# Patient Record
Sex: Female | Born: 1985 | Race: Black or African American | Hispanic: No | Smoking: Former smoker
Health system: Southern US, Community
[De-identification: ages and names within clinical notes are randomized; demographics above are authoritative.]

## PROBLEM LIST (undated history)

## (undated) ENCOUNTER — Ambulatory Visit (HOSPITAL_COMMUNITY): Admission: EM | Payer: Medicaid Other

## (undated) DIAGNOSIS — E119 Type 2 diabetes mellitus without complications: Secondary | ICD-10-CM

## (undated) DIAGNOSIS — F32A Depression, unspecified: Secondary | ICD-10-CM

## (undated) DIAGNOSIS — F329 Major depressive disorder, single episode, unspecified: Secondary | ICD-10-CM

## (undated) DIAGNOSIS — E079 Disorder of thyroid, unspecified: Secondary | ICD-10-CM

## (undated) HISTORY — PX: OTHER SURGICAL HISTORY: SHX169

## (undated) HISTORY — PX: WISDOM TOOTH EXTRACTION: SHX21

---
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
## 2020-02-21 ENCOUNTER — Encounter: Payer: Self-pay | Admitting: Emergency Medicine

## 2020-02-21 ENCOUNTER — Ambulatory Visit
Admission: EM | Admit: 2020-02-21 | Discharge: 2020-02-21 | Disposition: A | Discharge status: Home / Self Care | Payer: Medicaid Other | Attending: Physician Assistant | Admitting: Physician Assistant

## 2020-02-21 ENCOUNTER — Other Ambulatory Visit: Payer: Self-pay

## 2020-02-21 DIAGNOSIS — R109 Unspecified abdominal pain: Secondary | ICD-10-CM | POA: Diagnosis present

## 2020-02-21 DIAGNOSIS — N898 Other specified noninflammatory disorders of vagina: Secondary | ICD-10-CM | POA: Diagnosis not present

## 2020-02-21 HISTORY — DX: Type 2 diabetes mellitus without complications: E11.9

## 2020-02-21 HISTORY — DX: Depression, unspecified: F32.A

## 2020-02-21 HISTORY — DX: Disorder of thyroid, unspecified: E07.9

## 2020-02-21 LAB — POCT URINALYSIS DIP (MANUAL ENTRY)
Bilirubin, UA: NEGATIVE
Glucose, UA: NEGATIVE mg/dL
Ketones, POC UA: NEGATIVE mg/dL
Leukocytes, UA: NEGATIVE
Nitrite, UA: NEGATIVE
Protein Ur, POC: NEGATIVE mg/dL
Spec Grav, UA: >=1.03 — AB (ref 1.010–1.025)
Urobilinogen, UA: 0.2 E.U./dL
pH, UA: 6 (ref 5.0–8.0)

## 2020-02-21 LAB — POCT URINE PREGNANCY: Preg Test, Ur: NEGATIVE

## 2020-02-21 MED ORDER — METHOCARBAMOL 500 MG PO TABS: 500.0000 mg | ORAL_TABLET | Freq: Two times a day (BID) | ORAL | 0 refills | Status: DC

## 2020-02-21 MED ORDER — MELOXICAM 7.5 MG PO TABS: 7.5000 mg | ORAL_TABLET | Freq: Every day | ORAL | 0 refills | Status: DC

## 2020-02-21 MED ORDER — TAMSULOSIN HCL 0.4 MG PO CAPS: 0.4000 mg | ORAL_CAPSULE | Freq: Every day | ORAL | 0 refills | Status: DC

## 2020-02-21 NOTE — Discharge Instructions (Addendum)
Urine negative for infection. Does show some blood, which could be from a kidney stone. Start  flomax as directed. We will also cover for muscle pain. Start Mobic. Do not take ibuprofen (motrin/advil)/ naproxen (aleve) while on mobic. Robaxin as needed, this can make you drowsy, so do not take if you are going to drive, operate heavy machinery, or make important decisions. Cytology sent, you will be contacted with any positive results. If significant worsening of symptoms, go to the emergency department for further evaluation.

## 2020-02-21 NOTE — ED Provider Notes (Signed)
[facility]    CSN: [id number] Arrival date & time: 02/21/20  1037      History   Chief Complaint Chief Complaint  Patient presents with  . Flank Pain    HPI Michaela Diaz is a 34 y.o. female.   34 year old female comes in for 3 day history of flank pain. Feels bladder pressure when urination. Denies frequency, dysuria, hematuria. Nausea with vomiting that resolved after omeprazole. White vaginal discharge, no itching, odor. Right flank pain, present when sitting. Denies injury/trauma. LMP 01/28/2020. Sexually active 1 female partner. Changes in body wash. On OCP.      Past Medical History:  Diagnosis Date  . Depression   . Diabetes mellitus without complication ([place])   . Thyroid disease     There are no problems to display for this patient.   Past Surgical History:  Procedure Laterality Date  . removal of extra digits    . WISDOM TOOTH EXTRACTION      OB History   No obstetric history on file.      Home Medications    Prior to Admission medications   Medication Sig Start Date End Date Taking? Authorizing Provider  glycopyrrolate (ROBINUL) 2 MG tablet TAKE 1 TABLET BY MOUTH DAILY. 01/26/19  Yes [provider]  metFORMIN (GLUCOPHAGE-XR) 500 MG 24 hr tablet Take by mouth. 03/31/19  Yes [provider]  nystatin (MYCOSTATIN/NYSTOP) powder Apply to affected area daily 11/18/19  Yes [provider]  doxepin (SINEQUAN) 10 MG capsule Take 10 mg by mouth at bedtime. 09/30/19   [provider]  FLUoxetine (PROZAC) 20 MG capsule Take 60 mg by mouth every morning. 12/28/19   [provider]  meloxicam (MOBIC) 7.5 MG tablet Take 1 tablet (7.5 mg total) by mouth daily. 02/21/20   Ok Edwards, PA-C  methimazole (TAPAZOLE) 5 MG tablet  12/07/19   [provider]  methocarbamol (ROBAXIN) 500 MG tablet Take 1 tablet (500 mg total) by mouth 2 (two) times daily. 02/21/20   Damyiah Moxley V, PA-C  MICROGESTIN FE 1.5/30  1.5-30 MG-MCG tablet Take 1 tablet by mouth daily. 02/01/20   [provider]  ondansetron (ZOFRAN) 4 MG tablet Take 4 mg by mouth every 8 (eight) hours as needed. 11/18/19   [provider]  tamsulosin (FLOMAX) 0.4 MG CAPS capsule Take 1 capsule (0.4 mg total) by mouth daily. 02/21/20   Tasia Catchings, Lary Eckardt V, PA-C  Vitamin D, Ergocalciferol, (DRISDOL) 1.25 MG (50000 UNIT) CAPS capsule Take 50,000 Units by mouth once a week. 11/19/19   [provider]    Family History Family History  Problem Relation Age of Onset  . Diabetes Mother   . Depression Mother     Social History Social History   Tobacco Use  . Smoking status: Current Some Day Smoker  Substance Use Topics  . Alcohol use: Yes  . Drug use: Never     Allergies   Metronidazole and Latex   Review of Systems Review of Systems  Reason unable to perform ROS: See HPI as above.     Physical Exam Triage Vital Signs ED Triage Vitals [02/21/20 1057]  Enc Vitals Group     BP 118/81     Pulse Rate 95     Resp 16     Temp 97.9 F (36.6 C)     Temp Source Oral     SpO2 98 %     Weight      Height  Head Circumference      Peak Flow      Pain Score 5     Pain Loc      Pain Edu?      Excl. in GC?    No data found.  Updated Vital Signs BP 118/81 (BP Location: Right Arm)   Pulse 95   Temp 97.9 F (36.6 C) (Oral)   Resp 16   LMP 01/28/2020   SpO2 98%   Physical Exam Constitutional:      General: She is not in acute distress.    Appearance: She is well-developed. She is not ill-appearing, toxic-appearing or diaphoretic.  HENT:     Head: Normocephalic and atraumatic.  Eyes:     Conjunctiva/sclera: Conjunctivae normal.     Pupils: Pupils are equal, round, and reactive to light.  Cardiovascular:     Rate and Rhythm: Normal rate and regular rhythm.  Pulmonary:     Effort: Pulmonary effort is normal. No respiratory distress.     Comments: LCTAB Abdominal:     General: Bowel sounds are  normal.     Palpations: Abdomen is soft.     Tenderness: There is no abdominal tenderness. There is no right CVA tenderness, left CVA tenderness, guarding or rebound.  Musculoskeletal:     Cervical back: Normal range of motion and neck supple.     Comments: No tenderness to palpation of spinous processes. Tenderness to palpation of right lateral lumbar region. Full ROM of back. Negative straight leg raise  Skin:    General: Skin is warm and dry.  Neurological:     Mental Status: She is alert and oriented to person, place, and time.  Psychiatric:        Behavior: Behavior normal.        Judgment: Judgment normal.      UC Treatments / Results  Labs (all labs ordered are listed, but only abnormal results are displayed) Labs Reviewed  POCT URINALYSIS DIP (MANUAL ENTRY) - Abnormal; Notable for the following components:      Result Value   Spec Grav, UA >=1.030 (*)    Blood, UA trace-intact (*)    All other components within normal limits  POCT URINE PREGNANCY  CERVICOVAGINAL ANCILLARY ONLY    EKG   Radiology No results found.  Procedures Procedures (including critical care time)  Medications Ordered in UC Medications - No data to display  Initial Impression / Assessment and Plan / UC Course  I have reviewed the triage vital signs and the nursing notes.  Pertinent labs & imaging results that were available during my care of the patient were reviewed by me and considered in my medical decision making (see chart for details).    Urine dipstick with blood, discussed possibility of kidney stone causing symptoms, though lower suspicion given current exam. However, patient states had been told possible kidney stones in the past with similar symptoms. Start flomax as directed. Will also treat for MSK pain with NSAIDs and muscle relaxant. Will send for cytology. Return precautions given. Patient expresses understanding and agrees to plan.   Final Clinical Impressions(s) / UC  Diagnoses   Final diagnoses:  Right flank pain  Vaginal discharge    ED Prescriptions    Medication Sig Dispense Auth. Provider   meloxicam (MOBIC) 7.5 MG tablet Take 1 tablet (7.5 mg total) by mouth daily. 10 tablet Vidal Lampkins V, PA-C   methocarbamol (ROBAXIN) 500 MG tablet Take 1 tablet (500 mg total) by mouth 2 (  two) times daily. 20 tablet Mical Brun V, PA-C   tamsulosin (FLOMAX) 0.4 MG CAPS capsule Take 1 capsule (0.4 mg total) by mouth daily. 10 capsule Belinda Fisher, PA-C     PDMP not reviewed this encounter.   Belinda Fisher, PA-C 02/21/20 1359

## 2020-02-21 NOTE — ED Triage Notes (Signed)
3 days ago started having flank pain and bladder pressure. Pain upon urination.

## 2020-02-23 LAB — CERVICOVAGINAL ANCILLARY ONLY
Bacterial Vaginitis (gardnerella): POSITIVE — AB
Candida Glabrata: NEGATIVE
Candida Vaginitis: NEGATIVE
Chlamydia: NEGATIVE
Comment: NEGATIVE
Comment: NEGATIVE
Comment: NEGATIVE
Comment: NEGATIVE
Comment: NEGATIVE
Comment: NORMAL
Neisseria Gonorrhea: NEGATIVE
Trichomonas: NEGATIVE

## 2020-02-24 ENCOUNTER — Telehealth (HOSPITAL_COMMUNITY): Payer: Self-pay

## 2020-02-24 MED ORDER — METRONIDAZOLE 500 MG PO TABS: 500.0000 mg | ORAL_TABLET | Freq: Two times a day (BID) | ORAL | 0 refills | Status: DC

## 2020-06-16 ENCOUNTER — Emergency Department (HOSPITAL_COMMUNITY)
Admission: EM | Admit: 2020-06-16 | Discharge: 2020-06-17 | Disposition: A | Discharge status: Home / Self Care | Payer: Managed Care, Other (non HMO) | Attending: Emergency Medicine | Admitting: Emergency Medicine

## 2020-06-16 ENCOUNTER — Encounter (HOSPITAL_COMMUNITY): Payer: Self-pay | Admitting: Obstetrics and Gynecology

## 2020-06-16 ENCOUNTER — Other Ambulatory Visit: Payer: Self-pay

## 2020-06-16 DIAGNOSIS — G43909 Migraine, unspecified, not intractable, without status migrainosus: Secondary | ICD-10-CM | POA: Diagnosis not present

## 2020-06-16 DIAGNOSIS — R110 Nausea: Secondary | ICD-10-CM | POA: Diagnosis present

## 2020-06-16 DIAGNOSIS — Z5321 Procedure and treatment not carried out due to patient leaving prior to being seen by health care provider: Secondary | ICD-10-CM | POA: Diagnosis not present

## 2020-06-16 LAB — CBG MONITORING, ED: Glucose-Capillary: 85 mg/dL (ref 70–99)

## 2020-06-16 NOTE — ED Triage Notes (Signed)
Patient reports she wants to eat but feels nauseated and has a migraine. Patient reports this happened before and she needed fluids and migraine cocktail and felt better

## 2021-07-30 ENCOUNTER — Other Ambulatory Visit: Payer: Self-pay

## 2022-03-18 ENCOUNTER — Emergency Department (HOSPITAL_COMMUNITY): Payer: Managed Care, Other (non HMO)

## 2022-03-18 ENCOUNTER — Other Ambulatory Visit: Payer: Self-pay

## 2022-03-18 ENCOUNTER — Encounter (HOSPITAL_COMMUNITY): Payer: Self-pay

## 2022-03-18 ENCOUNTER — Emergency Department (HOSPITAL_COMMUNITY)
Admission: EM | Admit: 2022-03-18 | Discharge: 2022-03-18 | Disposition: A | Discharge status: Home / Self Care | Payer: Managed Care, Other (non HMO) | Attending: Emergency Medicine | Admitting: Emergency Medicine

## 2022-03-18 DIAGNOSIS — R197 Diarrhea, unspecified: Secondary | ICD-10-CM | POA: Insufficient documentation

## 2022-03-18 DIAGNOSIS — S6992XA Unspecified injury of left wrist, hand and finger(s), initial encounter: Secondary | ICD-10-CM | POA: Insufficient documentation

## 2022-03-18 DIAGNOSIS — R1084 Generalized abdominal pain: Secondary | ICD-10-CM | POA: Insufficient documentation

## 2022-03-18 DIAGNOSIS — R002 Palpitations: Secondary | ICD-10-CM | POA: Insufficient documentation

## 2022-03-18 DIAGNOSIS — F419 Anxiety disorder, unspecified: Secondary | ICD-10-CM | POA: Diagnosis not present

## 2022-03-18 DIAGNOSIS — Z91040 Latex allergy status: Secondary | ICD-10-CM | POA: Insufficient documentation

## 2022-03-18 DIAGNOSIS — Z79899 Other long term (current) drug therapy: Secondary | ICD-10-CM | POA: Diagnosis not present

## 2022-03-18 DIAGNOSIS — G43809 Other migraine, not intractable, without status migrainosus: Secondary | ICD-10-CM | POA: Diagnosis not present

## 2022-03-18 DIAGNOSIS — R630 Anorexia: Secondary | ICD-10-CM | POA: Diagnosis not present

## 2022-03-18 DIAGNOSIS — R0789 Other chest pain: Secondary | ICD-10-CM | POA: Diagnosis not present

## 2022-03-18 LAB — CBC WITH DIFFERENTIAL/PLATELET
Abs Immature Granulocytes: 0.02 10*3/uL (ref 0.00–0.07)
Basophils Absolute: 0 10*3/uL (ref 0.0–0.1)
Basophils Relative: 0 %
Eosinophils Absolute: 0 10*3/uL (ref 0.0–0.5)
Eosinophils Relative: 1 %
HCT: 43.7 % (ref 36.0–46.0)
Hemoglobin: 14.7 g/dL (ref 12.0–15.0)
Immature Granulocytes: 0 %
Lymphocytes Relative: 19 %
Lymphs Abs: 1.3 10*3/uL (ref 0.7–4.0)
MCH: 29.9 pg (ref 26.0–34.0)
MCHC: 33.6 g/dL (ref 30.0–36.0)
MCV: 88.8 fL (ref 80.0–100.0)
Monocytes Absolute: 0.4 10*3/uL (ref 0.1–1.0)
Monocytes Relative: 5 %
Neutro Abs: 5.4 10*3/uL (ref 1.7–7.7)
Neutrophils Relative %: 75 %
Platelets: 422 10*3/uL — ABNORMAL HIGH (ref 150–400)
RBC: 4.92 MIL/uL (ref 3.87–5.11)
RDW: 14.1 % (ref 11.5–15.5)
WBC: 7.1 10*3/uL (ref 4.0–10.5)
nRBC: 0 % (ref 0.0–0.2)

## 2022-03-18 LAB — COMPREHENSIVE METABOLIC PANEL
ALT: 35 U/L (ref 0–44)
AST: 34 U/L (ref 15–41)
Albumin: 4.2 g/dL (ref 3.5–5.0)
Alkaline Phosphatase: 87 U/L (ref 38–126)
Anion gap: 8 (ref 5–15)
BUN: 19 mg/dL (ref 6–20)
CO2: 24 mmol/L (ref 22–32)
Calcium: 9.3 mg/dL (ref 8.9–10.3)
Chloride: 108 mmol/L (ref 98–111)
Creatinine, Ser: 0.94 mg/dL (ref 0.44–1.00)
GFR, Estimated: >60 mL/min (ref >60)
Glucose, Bld: 102 mg/dL — ABNORMAL HIGH (ref 70–99)
Potassium: 4.1 mmol/L (ref 3.5–5.1)
Sodium: 140 mmol/L (ref 135–145)
Total Bilirubin: 1.1 mg/dL (ref 0.3–1.2)
Total Protein: 8.2 g/dL — ABNORMAL HIGH (ref 6.5–8.1)

## 2022-03-18 LAB — LIPASE, BLOOD: Lipase: 28 U/L (ref 11–51)

## 2022-03-18 LAB — URINALYSIS, ROUTINE W REFLEX MICROSCOPIC
Bilirubin Urine: NEGATIVE
Glucose, UA: NEGATIVE mg/dL
Hgb urine dipstick: NEGATIVE
Ketones, ur: NEGATIVE mg/dL
Leukocytes,Ua: NEGATIVE
Nitrite: NEGATIVE
Protein, ur: 30 mg/dL — AB
Specific Gravity, Urine: 1.028 (ref 1.005–1.030)
pH: 5 (ref 5.0–8.0)

## 2022-03-18 LAB — I-STAT BETA HCG BLOOD, ED (MC, WL, AP ONLY): I-stat hCG, quantitative: <5 m[IU]/mL (ref <5)

## 2022-03-18 LAB — TROPONIN I (HIGH SENSITIVITY): Troponin I (High Sensitivity): <2 ng/L (ref <18)

## 2022-03-18 LAB — CBG MONITORING, ED: Glucose-Capillary: 115 mg/dL — ABNORMAL HIGH (ref 70–99)

## 2022-03-18 MED ORDER — METOCLOPRAMIDE HCL 5 MG/ML IJ SOLN
10.0000 mg | Freq: Once | INTRAMUSCULAR | Status: AC
Start: 2022-03-18 — End: 2022-03-18
  Administered 2022-03-18: 10 mg via INTRAVENOUS
  Filled 2022-03-18: qty 2

## 2022-03-18 MED ORDER — LIDOCAINE HCL (PF) 1 % IJ SOLN
30.0000 mL | Freq: Once | INTRAMUSCULAR | Status: DC
  Filled 2022-03-18: qty 30

## 2022-03-18 MED ORDER — CEPHALEXIN 500 MG PO CAPS: 500.0000 mg | ORAL_CAPSULE | Freq: Two times a day (BID) | ORAL | 0 refills | Status: DC

## 2022-03-18 MED ORDER — DIPHENHYDRAMINE HCL 50 MG/ML IJ SOLN
12.5000 mg | Freq: Once | INTRAMUSCULAR | Status: AC
  Administered 2022-03-18: 12.5 mg via INTRAVENOUS
  Filled 2022-03-18: qty 1

## 2022-03-18 MED ORDER — CEPHALEXIN 500 MG PO CAPS: 500.0000 mg | ORAL_CAPSULE | Freq: Two times a day (BID) | ORAL | 0 refills | Status: AC

## 2022-03-18 MED ORDER — SODIUM CHLORIDE 0.9 % IV BOLUS
1000.0000 mL | Freq: Once | INTRAVENOUS | Status: AC
  Administered 2022-03-18: 1000 mL via INTRAVENOUS
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
## 2022-03-18 NOTE — ED Triage Notes (Signed)
Patient presented to the ed with c/o anxiety and she reported she have hx of seizure 10 years ago.  ?

## 2022-03-18 NOTE — Discharge Instructions (Addendum)
Take the antibiotics as directed to prevent infection with your hand injury. ?Make sure you follow-up with your primary care provider and continue your home medications as previously prescribed. ?Return to the ER for signs of infection including redness, swelling, pus draining from the area, fever or other symptoms like chest pain, shortness of breath, severe abdominal pain ?

## 2022-10-28 IMAGING — CT CT HEAD W/O CM
3 series · 16 of 47 positions shown, 19 images · non-contrast
Comparison: MRI dated December 21, 2017

CLINICAL DATA: Syncope/presyncope, cerebrovascular cause suspected



[Series 4: head wo · axial · 0.47mm/px · z∈[+1464,+1604]mm · 10 of 34 slices shown, 13 images]
[im 3/34  brain]
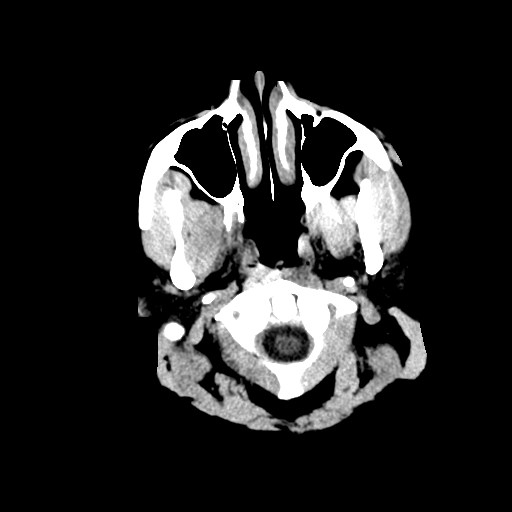
[im 3/34  bone]
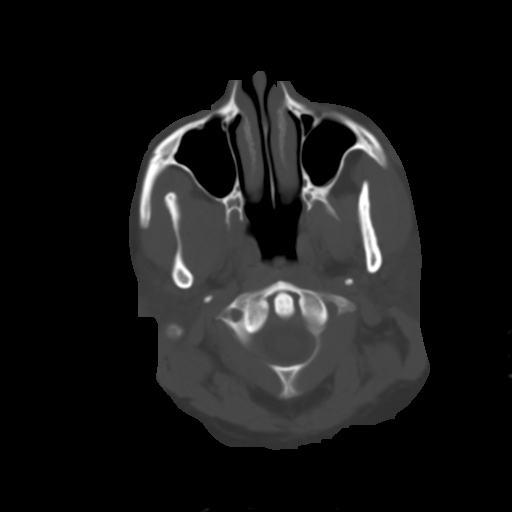
[im 6/34  brain]
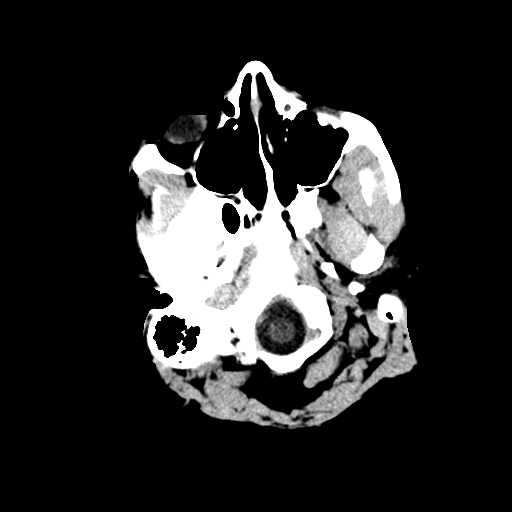
[im 10/34  brain]
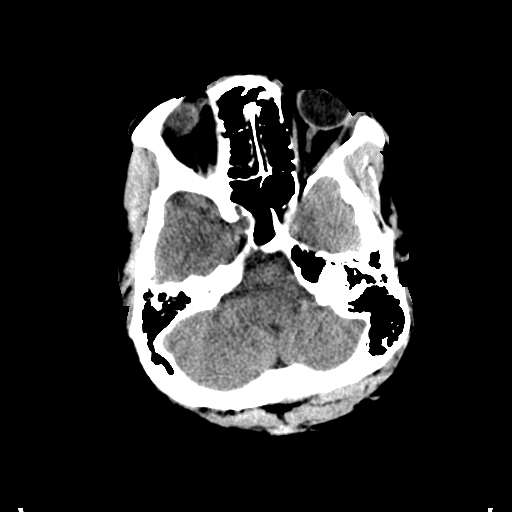
[im 12/34  brain]
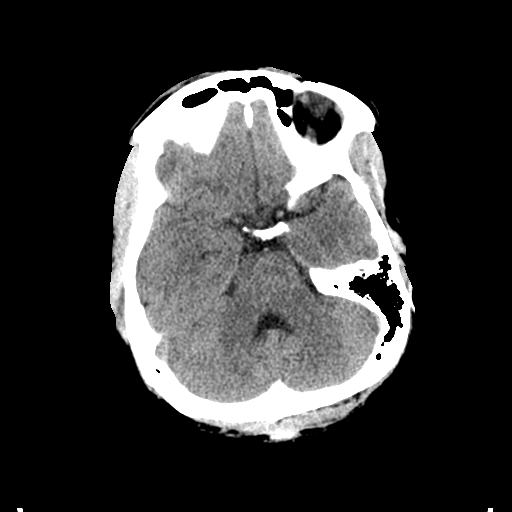
[im 15/34  brain]
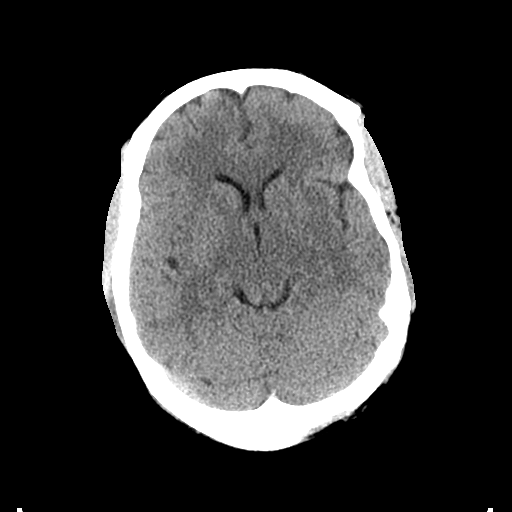
[im 15/34  bone]
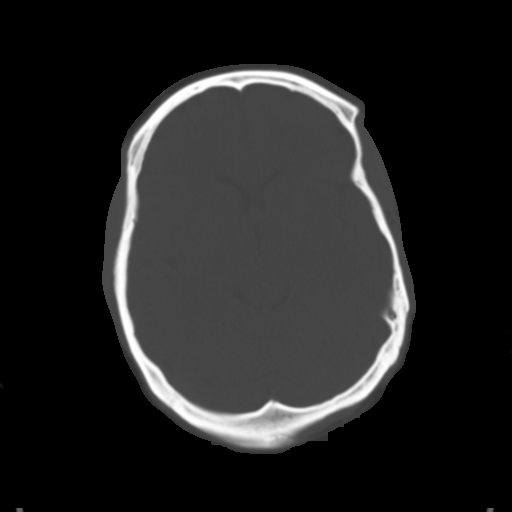
[im 19/34  brain]
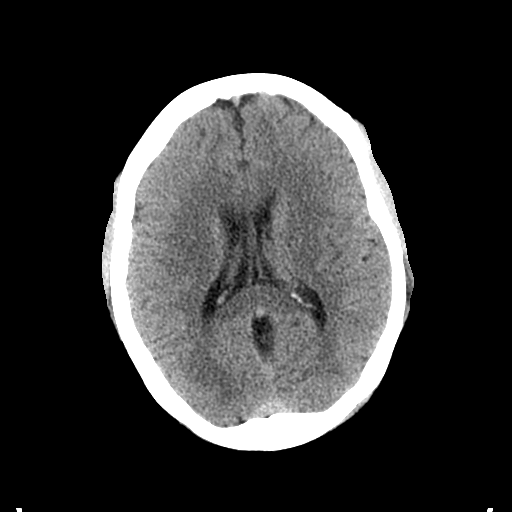
[im 22/34  brain]
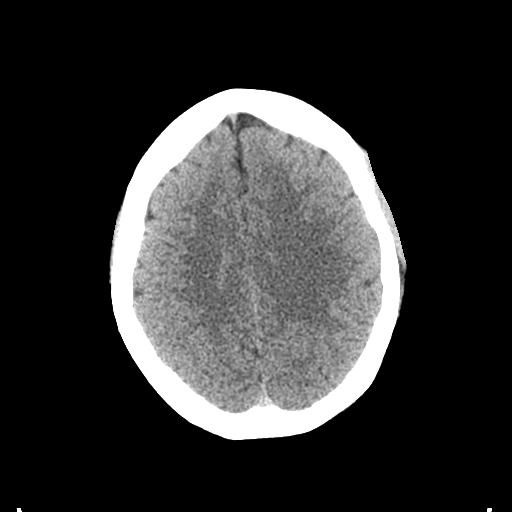
[im 26/34  brain]
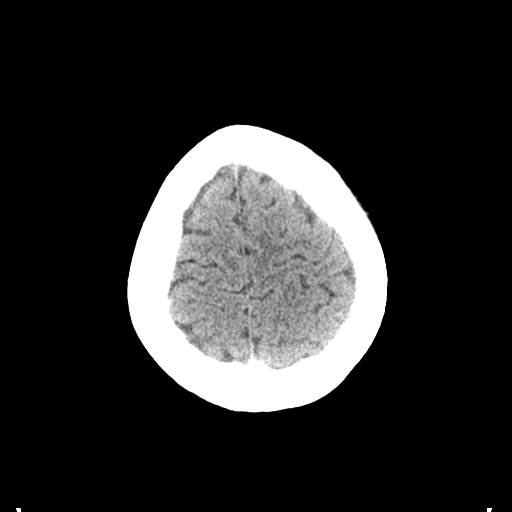
[im 28/34  brain]
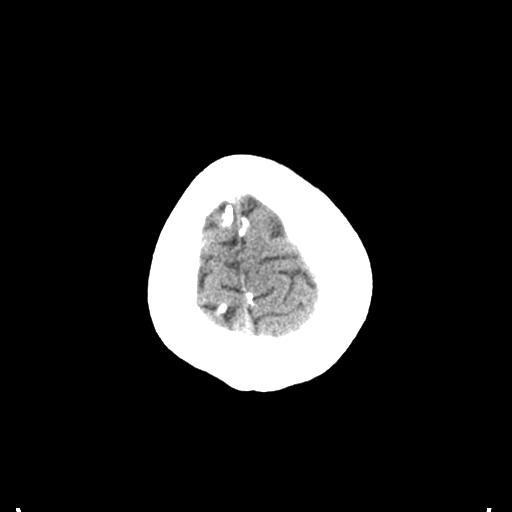
[im 28/34  bone]
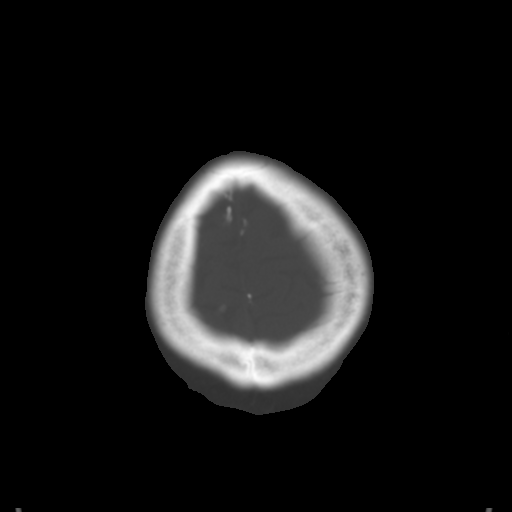
[im 31/34  brain]
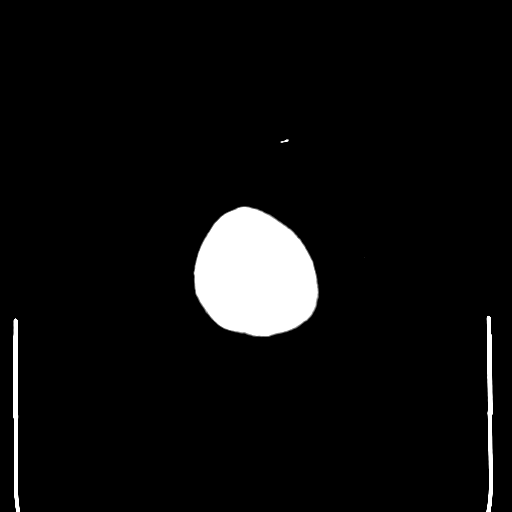

[Series 5: coronal soft tissue · coronal · 0.33mm/px · 3 of 71 slices shown]
[im 24/71  brain]
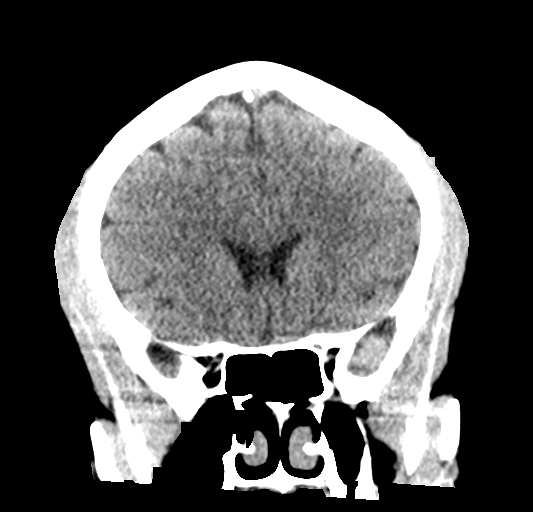
[im 32/71  brain]
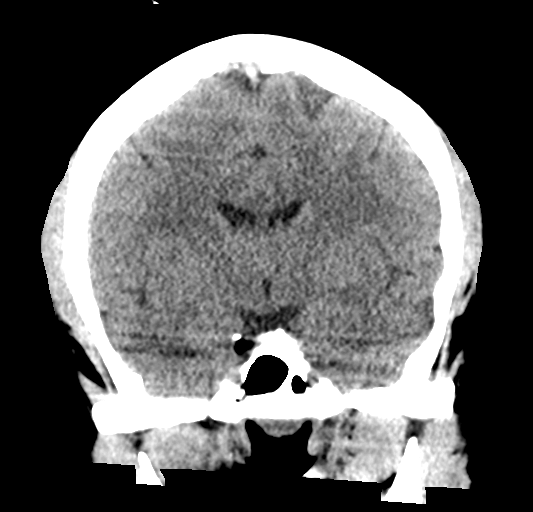
[im 39/71  brain]
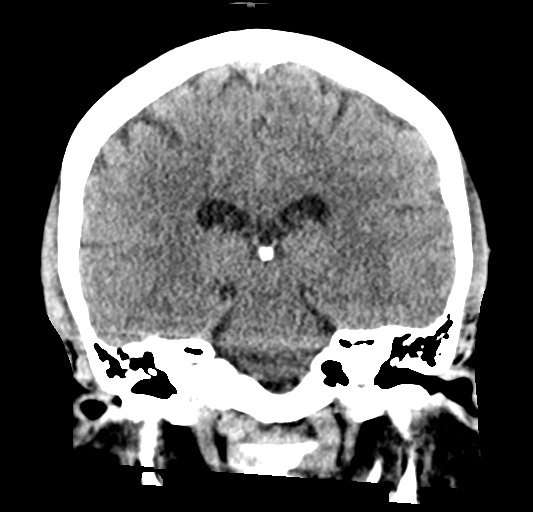

[Series 6: sagittal soft tissue · sagittal · 0.33mm/px · 3 of 60 slices shown]
[im 20/60  brain]
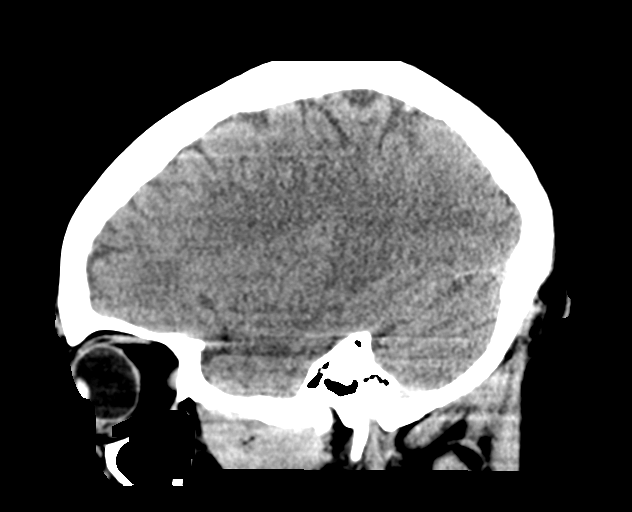
[im 30/60  brain]
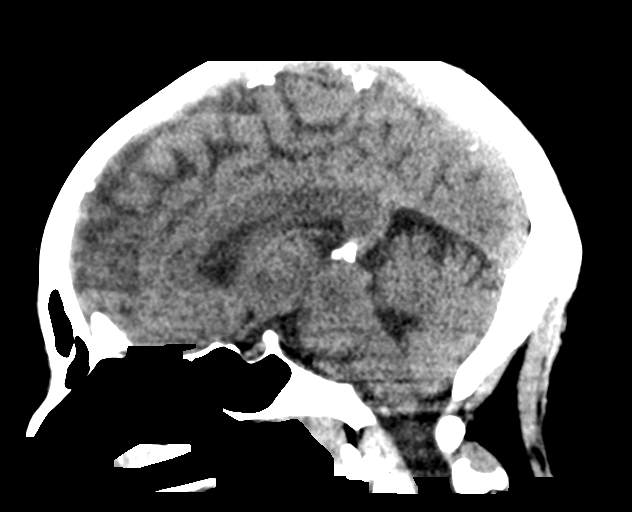
[im 40/60  brain]
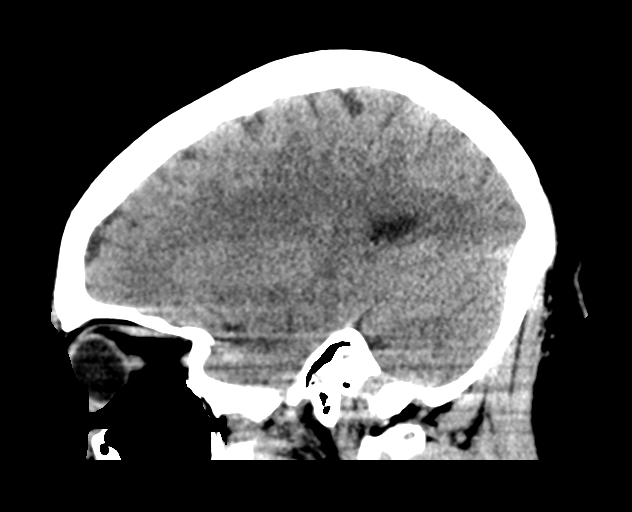

[16 of 47 positions shown; findings below may reference images not displayed]

FINDINGS: Brain: No evidence of acute infarction, hemorrhage, hydrocephalus,
extra-axial collection or mass lesion/mass effect.

Vascular: No hyperdense vessel or unexpected calcification.

Skull: Normal. Negative for fracture or focal lesion.

Sinuses/Orbits: No acute finding.

Other: None.
IMPRESSION: No acute intracranial abnormality.

## 2023-08-01 ENCOUNTER — Encounter: Payer: Self-pay | Admitting: Pulmonary Disease

## 2023-08-01 ENCOUNTER — Institutional Professional Consult (permissible substitution): Payer: Managed Care, Other (non HMO) | Admitting: Pulmonary Disease

## 2023-09-19 ENCOUNTER — Encounter: Payer: Self-pay | Admitting: Nurse Practitioner

## 2023-09-19 ENCOUNTER — Ambulatory Visit (INDEPENDENT_AMBULATORY_CARE_PROVIDER_SITE_OTHER): Payer: Managed Care, Other (non HMO) | Admitting: Nurse Practitioner

## 2023-09-19 VITALS — HR 100 | Temp 98.2°F | Ht 61.5 in | Wt 190.8 lb

## 2023-09-19 DIAGNOSIS — R0683 Snoring: Secondary | ICD-10-CM | POA: Diagnosis not present

## 2023-09-19 DIAGNOSIS — E66812 Obesity, class 2: Secondary | ICD-10-CM | POA: Diagnosis not present

## 2023-09-19 DIAGNOSIS — G4719 Other hypersomnia: Secondary | ICD-10-CM | POA: Diagnosis not present

## 2023-09-19 NOTE — Progress Notes (Signed)
@Patient  ID: Michaela Diaz, female    DOB: [birth year], 37 y.o.   MRN: [id number]  Chief Complaint  Patient presents with   Consult    Fatigue during day.  Not getting restful sleep.  Snoring.  Coughing during sleep and night.    Referring provider: [name], PA  HPI: 37 year old female, former smoker referred for sleep consult. Past medical history significant for GERD, depression, allergic rhinitis, hypothyroid.   TEST/EVENTS:   09/19/2023: Today - sleep consult Patient presents today for sleep consult, referred by [name], PA.  She has had issues with her sleep for the past few years now.  She is also felt very tired during the day.  Symptoms started after she started working third shift.  She just feels like she is never gotten back to feeling rested since then.  She did have a baby 5 months ago but actually feels like her symptoms have slightly improved with the weight loss in the postpartum period.  She has snoring at night.  Does not feel rested when she wakes up in the morning.  Feels sleepy throughout the day.  Her PCP did start her on Adderall which does help.  Not having to take a nap at lunchtime anymore.  Does wake up coughing at night sometimes.  Denies any witnessed apneas, drowsy driving, sleep parasomnia/paralysis.  No history of narcolepsy or symptoms of cataplexy. She goes to bed around 10:30 PM.  Falls asleep within 10 minutes.  Wakes 1-2 times a night.  Usually gets up around 530 to 6 AM.  Does not operate any heavy machinery in her job Animal nutritionist.  No longer working third shift.  Does not seem to have disruptions in her sleep related to this.  She is lost about 35 pounds since she had her baby in May.  He is sleeping through the night.  Never had a sleep study before.  No sleep aids.  Does not use any supplemental oxygen. She had gestational diabetes.  Most recent A1c 5.  No history of cardiac disease or stroke. She is a former smoker and quit October  2023.  She has not drink any alcohol since October 2023.  No excessive caffeine intake.  Lives with herself and 4 kids.  Works as a Clinical biochemist.  She is separated.  No significant family history reported.  Epworth 18   Allergies  Allergen Reactions   Metronidazole Nausea Only   Buspirone Hcl Other (See Comments)    headaches   Latex Rash    Immunization History  Administered Date(s) Administered   PFIZER(Purple Top)SARS-COV-2 Vaccination 06/29/2020, 07/20/2020    Past Medical History:  Diagnosis Date   Depression    Diabetes mellitus without complication (HCC)    Thyroid disease     Tobacco History: Social History   Tobacco Use  Smoking Status Former   Current packs/day: 0.00   Types: Cigarettes   Quit date: 08/2022   Years since quitting: 1.1  Smokeless Tobacco Not on file   Counseling given: Not Answered   Outpatient Medications Prior to Visit  Medication Sig Dispense Refill   amphetamine-dextroamphetamine (ADDERALL XR) 20 MG 24 hr capsule Take 20 mg by mouth daily. 20 mg after lunch     amphetamine-dextroamphetamine (ADDERALL XR) 30 MG 24 hr capsule Take 30 mg by mouth every morning.     esomeprazole (NEXIUM) 40 MG capsule Take 40 mg by mouth daily.     FLUoxetine (PROZAC) 20 MG capsule Take 60 mg  by mouth every morning.     fluticasone (FLONASE) 50 MCG/ACT nasal spray Place 1 spray into both nostrils daily.     ibuprofen (ADVIL) 600 MG tablet Take 600 mg by mouth every 4 (four) hours as needed.     levocetirizine (XYZAL) 5 MG tablet Take 5 mg by mouth every evening.     levothyroxine (SYNTHROID) 137 MCG tablet Take 137 mcg by mouth daily before breakfast.     mometasone (ELOCON) 0.1 % cream Apply 1 Application topically daily.     Prenatal Vit-Fe Fumarate-FA (PRENATAL VITAMIN PLUS LOW IRON) 27-1 MG TABS Take by mouth.     valACYclovir (VALTREX) 500 MG tablet Take by mouth.     doxepin (SINEQUAN) 10 MG capsule Take 10 mg by mouth at bedtime. (Patient not taking:  Reported on 09/19/2023)     glycopyrrolate (ROBINUL) 2 MG tablet TAKE 1 TABLET BY MOUTH DAILY. (Patient not taking: Reported on 09/19/2023)     meloxicam (MOBIC) 7.5 MG tablet Take 1 tablet (7.5 mg total) by mouth daily. (Patient not taking: Reported on 09/19/2023) 10 tablet 0   metFORMIN (GLUCOPHAGE-XR) 500 MG 24 hr tablet Take by mouth. (Patient not taking: Reported on 09/19/2023)     methimazole (TAPAZOLE) 5 MG tablet  (Patient not taking: Reported on 09/19/2023)     methocarbamol (ROBAXIN) 500 MG tablet Take 1 tablet (500 mg total) by mouth 2 (two) times daily. (Patient not taking: Reported on 09/19/2023) 20 tablet 0   metroNIDAZOLE (FLAGYL) 500 MG tablet Take 1 tablet (500 mg total) by mouth 2 (two) times daily. (Patient not taking: Reported on 09/19/2023) 14 tablet 0   MICROGESTIN FE 1.5/30 1.5-30 MG-MCG tablet Take 1 tablet by mouth daily. (Patient not taking: Reported on 09/19/2023)     nystatin (MYCOSTATIN/NYSTOP) powder Apply to affected area daily (Patient not taking: Reported on 09/19/2023)     ondansetron (ZOFRAN) 4 MG tablet Take 4 mg by mouth every 8 (eight) hours as needed. (Patient not taking: Reported on 09/19/2023)     tamsulosin (FLOMAX) 0.4 MG CAPS capsule Take 1 capsule (0.4 mg total) by mouth daily. (Patient not taking: Reported on 09/19/2023) 10 capsule 0   Vitamin D, Ergocalciferol, (DRISDOL) 1.25 MG (50000 UNIT) CAPS capsule Take 50,000 Units by mouth once a week. (Patient not taking: Reported on 09/19/2023)     No facility-administered medications prior to visit.     Review of Systems:   Constitutional: No night sweats, fevers, chills, or lassitude. +fatigue, weight change HEENT: No headaches, difficulty swallowing, tooth/dental problems, or sore throat. No sneezing, itching, ear ache, nasal congestion, or post nasal drip CV:  No chest pain, orthopnea, PND, swelling in lower extremities, anasarca, dizziness, palpitations, syncope Resp: +nocturnal cough, snoring. No  shortness of breath with exertion or at rest. No excess mucus or change in color of mucus. No productive or non-productive. No hemoptysis. No wheezing.  No chest wall deformity GI:  No heartburn, indigestion GU: No nocturia Skin: No rash, lesions, ulcerations MSK:  No joint pain or swelling.   Neuro: No dizziness or lightheadedness.  Psych: No depression or anxiety. Mood stable.     Physical Exam:  Pulse 100   Temp 98.2 F (36.8 C) (Oral)   Ht 5' 1.5" (1.562 m)   Wt 190 lb 12.8 oz (86.5 kg)   SpO2 98%   BMI 35.47 kg/m   GEN: Pleasant, interactive, well-appearing; obese; in no acute distress HEENT:  Normocephalic and atraumatic. PERRLA. Sclera white. Nasal turbinates pink,  moist and patent bilaterally. No rhinorrhea present. Oropharynx pink and moist, without exudate or edema. No lesions, ulcerations, or postnasal drip. Mallampati III NECK:  Supple w/ fair ROM. No JVD present. Normal carotid impulses w/o bruits. Thyroid symmetrical with no goiter or nodules palpated. No lymphadenopathy.   CV: RRR, no m/r/g, no peripheral edema. Pulses intact, +2 bilaterally. No cyanosis, pallor or clubbing. PULMONARY:  Unlabored, regular breathing. Clear bilaterally A&P w/o wheezes/rales/rhonchi. No accessory muscle use.  GI: BS present and normoactive. Soft, non-tender to palpation. No organomegaly or masses detected.  MSK: No erythema, warmth or tenderness. Cap refil <2 sec all extrem. No deformities or joint swelling noted.  Neuro: A/Ox3. No focal deficits noted.   Skin: Warm, no lesions or rashe Psych: Normal affect and behavior. Judgement and thought content appropriate.     Lab Results:  CBC    Component Value Date/Time   WBC 7.1 03/18/2022 1219   RBC 4.92 03/18/2022 1219   HGB 14.7 03/18/2022 1219   HCT 43.7 03/18/2022 1219   PLT 422 (H) 03/18/2022 1219   MCV 88.8 03/18/2022 1219   MCH 29.9 03/18/2022 1219   MCHC 33.6 03/18/2022 1219   RDW 14.1 03/18/2022 1219   LYMPHSABS 1.3  03/18/2022 1219   MONOABS 0.4 03/18/2022 1219   EOSABS 0.0 03/18/2022 1219   BASOSABS 0.0 03/18/2022 1219    BMET    Component Value Date/Time   NA 140 03/18/2022 1311   K 4.1 03/18/2022 1311   CL 108 03/18/2022 1311   CO2 24 03/18/2022 1311   GLUCOSE 102 (H) 03/18/2022 1311   BUN 19 03/18/2022 1311   CREATININE 0.94 03/18/2022 1311   CALCIUM 9.3 03/18/2022 1311   GFRNONAA >60 03/18/2022 1311    BNP No results found for: "BNP"   Imaging:  No results found.  Administration History     None           No data to display          No results found for: "NITRICOXIDE"      Assessment & Plan:   Snoring She has snoring, excessive daytime sleepiness. BMI 35. Epworth 18. Given this,  I am concerned she could have sleep disordered breathing with obstructive sleep apnea. She will need sleep study for further evaluation.    - discussed how weight can impact sleep and risk for sleep disordered breathing - discussed options to assist with weight loss: combination of diet modification, cardiovascular and strength training exercises   - had an extensive discussion regarding the adverse health consequences related to untreated sleep disordered breathing - specifically discussed the risks for hypertension, coronary artery disease, cardiac dysrhythmias, cerebrovascular disease, and diabetes - lifestyle modification discussed   - discussed how sleep disruption can increase risk of accidents, particularly when driving - safe driving practices were discussed  Patient Instructions  Given your symptoms, I am concerned that you may have sleep disordered breathing with sleep apnea. You will need a sleep study for further evaluation. Someone will contact you to schedule this.   We discussed how untreated sleep apnea puts an individual at risk for cardiac arrhthymias, pulm HTN, DM, stroke and increases their risk for daytime accidents. We also briefly reviewed treatment options  including weight loss, side sleeping position, oral appliance, CPAP therapy or referral to ENT for possible surgical options  Use caution when driving and pull over if you become sleepy.  Follow up in 6-8 weeks with Katie Jakell Trusty,NP to go over sleep study  results, or sooner, if needed. Friday PM virtual visit if ok with patient     Excessive daytime sleepiness See above. Possibly due to untreated OSA. Improves with adderrall prescribed by PCP. No aberrant behavior.   Obesity, Class II, BMI 35-39.9 BMI 35. Healthy weight loss encouraged   Advised if symptoms do not improve or worsen, to please contact office for sooner follow up or seek emergency care.   I spent 35 minutes of dedicated to the care of this patient on the date of this encounter to include pre-visit review of records, face-to-face time with the patient discussing conditions above, post visit ordering of testing, clinical documentation with the electronic health record, making appropriate referrals as documented, and communicating necessary findings to members of the patients care team.  Noemi Chapel, NP 09/20/2023  Pt aware and understands NP's role.

## 2023-09-19 NOTE — Patient Instructions (Signed)
Given your symptoms, I am concerned that you may have sleep disordered breathing with sleep apnea. You will need a sleep study for further evaluation. Someone will contact you to schedule this.   We discussed how untreated sleep apnea puts an individual at risk for cardiac arrhthymias, pulm HTN, DM, stroke and increases their risk for daytime accidents. We also briefly reviewed treatment options including weight loss, side sleeping position, oral appliance, CPAP therapy or referral to ENT for possible surgical options  Use caution when driving and pull over if you become sleepy.  Follow up in 6-8 weeks with Michaela Mylynn Dinh,NP to go over sleep study results, or sooner, if needed. Friday PM virtual visit if ok with patient

## 2023-09-20 ENCOUNTER — Encounter: Payer: Self-pay | Admitting: Nurse Practitioner

## 2023-09-20 DIAGNOSIS — G4719 Other hypersomnia: Secondary | ICD-10-CM | POA: Insufficient documentation

## 2023-09-20 DIAGNOSIS — E66812 Obesity, class 2: Secondary | ICD-10-CM | POA: Insufficient documentation

## 2023-09-20 DIAGNOSIS — R0683 Snoring: Secondary | ICD-10-CM | POA: Insufficient documentation

## 2023-09-20 NOTE — Assessment & Plan Note (Signed)
BMI 35. Healthy weight loss encouraged.  

## 2023-09-20 NOTE — Assessment & Plan Note (Signed)
See above. Possibly due to untreated OSA. Improves with adderrall prescribed by PCP. No aberrant behavior.

## 2023-09-20 NOTE — Assessment & Plan Note (Signed)
She has snoring, excessive daytime sleepiness. BMI 35. Epworth 18. Given this,  I am concerned she could have sleep disordered breathing with obstructive sleep apnea. She will need sleep study for further evaluation.    - discussed how weight can impact sleep and risk for sleep disordered breathing - discussed options to assist with weight loss: combination of diet modification, cardiovascular and strength training exercises   - had an extensive discussion regarding the adverse health consequences related to untreated sleep disordered breathing - specifically discussed the risks for hypertension, coronary artery disease, cardiac dysrhythmias, cerebrovascular disease, and diabetes - lifestyle modification discussed   - discussed how sleep disruption can increase risk of accidents, particularly when driving - safe driving practices were discussed  Patient Instructions  Given your symptoms, I am concerned that you may have sleep disordered breathing with sleep apnea. You will need a sleep study for further evaluation. Someone will contact you to schedule this.   We discussed how untreated sleep apnea puts an individual at risk for cardiac arrhthymias, pulm HTN, DM, stroke and increases their risk for daytime accidents. We also briefly reviewed treatment options including weight loss, side sleeping position, oral appliance, CPAP therapy or referral to ENT for possible surgical options  Use caution when driving and pull over if you become sleepy.  Follow up in 6-8 weeks with Katie Dena Esperanza,NP to go over sleep study results, or sooner, if needed. Friday PM virtual visit if ok with patient

## 2023-11-11 DIAGNOSIS — G4719 Other hypersomnia: Secondary | ICD-10-CM

## 2023-11-11 DIAGNOSIS — R0683 Snoring: Secondary | ICD-10-CM

## 2023-11-15 ENCOUNTER — Telehealth: Payer: Managed Care, Other (non HMO) | Admitting: Nurse Practitioner

## 2023-11-18 ENCOUNTER — Telehealth: Payer: Self-pay | Admitting: Pulmonary Disease

## 2023-11-18 NOTE — Telephone Encounter (Signed)
 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  CPAP therapy if there is significant daytime sleepiness or other comorbidities including history of CVA or cardiac disease -If CPAP is chosen as an option of treatment auto titrating CPAP with a pressure setting of 5-15 will be appropriate  2.  Watchful waiting with emphasis on weight loss measures, sleep position modification to optimize lateral sleep, elevating the head of the bed by about 30 degrees may also help.  3.  An oral device may be fashioned for the treatment of mild sleep disordered breathing, will involve referral to dentist.   Follow-up as previously scheduled

## 2023-11-19 NOTE — Telephone Encounter (Signed)
 Mild OSA. Please scheduled f/u to discuss results/treatment options. I do have openings today if she can do a video visit. Thanks.

## 2023-12-20 ENCOUNTER — Encounter: Payer: Self-pay | Admitting: Nurse Practitioner

## 2023-12-20 ENCOUNTER — Telehealth (INDEPENDENT_AMBULATORY_CARE_PROVIDER_SITE_OTHER): Payer: Managed Care, Other (non HMO) | Admitting: Nurse Practitioner

## 2023-12-20 DIAGNOSIS — G4733 Obstructive sleep apnea (adult) (pediatric): Secondary | ICD-10-CM | POA: Insufficient documentation

## 2023-12-20 DIAGNOSIS — E66812 Obesity, class 2: Secondary | ICD-10-CM

## 2023-12-20 NOTE — Assessment & Plan Note (Signed)
Healthy weight loss encouraged

## 2023-12-20 NOTE — Assessment & Plan Note (Signed)
Mild OSA. Discussed minimal cardiovascular risk with mild OSA; however, given significant symptoms shared decision to move forward with CPAP therapy. Oral appliance is not a good option for her right now due to current orthodontic bills with Invisalign. Orders placed for auto CPAP 5-15 cmH2O, mask of choice, heated humidity. Educated on proper care/use of device. Risks/benefits reviewed. Healthy weight loss encouraged. Aware of safe driving practices.   Patient Instructions  Start to use CPAP every night, minimum of 4-6 hours a night.  Change equipment as directed. Wash your tubing with warm soap and water daily, hang to dry. Wash humidifier portion weekly. Use bottled, distilled water and change daily Be aware of reduced alertness and do not drive or operate heavy machinery if experiencing this or drowsiness.  Exercise encouraged, as tolerated. Healthy weight management discussed.  Avoid or decrease alcohol consumption and medications that make you more sleepy, if possible. Notify if persistent daytime sleepiness occurs even with consistent use of PAP therapy.  We discussed how untreated sleep apnea puts an individual at risk for cardiac arrhthymias, pulm HTN, DM, stroke and increases their risk for daytime accidents. We also briefly reviewed treatment options including weight loss, side sleeping position, oral appliance, CPAP therapy   Change supplies... Every month Mask cushions and/or nasal pillows CPAP machine filters Every 3 months Mask frame (not including the headgear) CPAP tubing Every 6 months Mask headgear Chin strap (if applicable) Humidifier water tub  Change sooner if soiled  Follow up in 10-12 weeks with Katie Jarman Litton,NP, or sooner, if needed

## 2023-12-20 NOTE — Progress Notes (Signed)
Patient ID: Michaela Diaz, female     DOB: Nov 18, 1985, 38 y.o.      MRN: [id number]  No chief complaint on file.   Virtual Visit via Video Note  I connected with Michaela Diaz on 12/20/23 at  1:30 PM EST by a video enabled telemedicine application and verified that I am speaking with the correct person using two identifiers.  Location: Patient: Home Provider: Office   I discussed the limitations of evaluation and management by telemedicine and the availability of in person appointments. The patient expressed understanding and agreed to proceed.  History of Present Illness: 38 year old female, former smoker followed for mild OSA. Past medical history significant for GERD, depression, allergic rhinitis, hypothyroid.    TEST/EVENTS:  11/11/2023 HST: AHI 9.9/h, SpO2 low 82%  09/19/2023: OV with Michaela Letendre NP for sleep consult, referred by Michaela Oar, PA.  She has had issues with her sleep for the past few years now.  She is also felt very tired during the day.  Symptoms started after she started working third shift.  She just feels like she is never gotten back to feeling rested since then.  She did have a baby 5 months ago but actually feels like her symptoms have slightly improved with the weight loss in the postpartum period.  She has snoring at night.  Does not feel rested when she wakes up in the morning.  Feels sleepy throughout the day.  Her PCP did start her on Adderall which does help.  Not having to take a nap at lunchtime anymore.  Does wake up coughing at night sometimes.  Denies any witnessed apneas, drowsy driving, sleep parasomnia/paralysis.  No history of narcolepsy or symptoms of cataplexy. She goes to bed around 10:30 PM.  Falls asleep within 10 minutes.  Wakes 1-2 times a night.  Usually gets up around 530 to 6 AM.  Does not operate any heavy machinery in her job Animal nutritionist.  No longer working third shift.  Does not seem to have disruptions in her sleep related to this.   She is lost about 35 pounds since she had her baby in May.  He is sleeping through the night.  Never had a sleep study before.  No sleep aids.  Does not use any supplemental oxygen. She had gestational diabetes.  Most recent A1c 5.  No history of cardiac disease or stroke. She is a former smoker and quit October 2023.  She has not drink any alcohol since October 2023.  No excessive caffeine intake.  Lives with herself and 4 kids.  Works as a Clinical biochemist.  She is separated.  No significant family history reported. [place] 18  12/20/2023: Today - follow up Patient presents today for follow up to review HST results which revealed mild OSA. She continues to have trouble with her sleep and fatigue. She does snore at night. She denies sleep parasomnias/paralysis. She wants to discuss treatment options.   Allergies  Allergen Reactions   Metronidazole Nausea Only   Buspirone Hcl Other (See Comments)    headaches   Latex Rash   Immunization History  Administered Date(s) Administered   PFIZER(Purple Top)SARS-COV-2 Vaccination 06/29/2020, 07/20/2020   Past Medical History:  Diagnosis Date   Depression    Diabetes mellitus without complication (HCC)    Thyroid disease     Tobacco History: Social History   Tobacco Use  Smoking Status Former   Current packs/day: 0.00   Types: Cigarettes   Quit date: 08/2022  Years since quitting: 1.3  Smokeless Tobacco Not on file   Counseling given: Not Answered   Outpatient Medications Prior to Visit  Medication Sig Dispense Refill   amphetamine-dextroamphetamine (ADDERALL XR) 20 MG 24 hr capsule Take 20 mg by mouth daily. 20 mg after lunch     amphetamine-dextroamphetamine (ADDERALL XR) 30 MG 24 hr capsule Take 30 mg by mouth every morning.     esomeprazole (NEXIUM) 40 MG capsule Take 40 mg by mouth daily.     FLUoxetine (PROZAC) 20 MG capsule Take 60 mg by mouth every morning.     fluticasone (FLONASE) 50 MCG/ACT nasal spray Place 1 spray into both  nostrils daily.     ibuprofen (ADVIL) 600 MG tablet Take 600 mg by mouth every 4 (four) hours as needed.     levocetirizine (XYZAL) 5 MG tablet Take 5 mg by mouth every evening.     levothyroxine (SYNTHROID) 137 MCG tablet Take 137 mcg by mouth daily before breakfast.     mometasone (ELOCON) 0.1 % cream Apply 1 Application topically daily.     Prenatal Vit-Fe Fumarate-FA (PRENATAL VITAMIN PLUS LOW IRON) 27-1 MG TABS Take by mouth.     valACYclovir (VALTREX) 500 MG tablet Take by mouth.     No facility-administered medications prior to visit.     Review of Systems:   Constitutional: No night sweats, fevers, chills, or lassitude. +fatigue, weight change HEENT: No headaches, difficulty swallowing, tooth/dental problems, or sore throat. No sneezing, itching, ear ache, nasal congestion, or post nasal drip CV:  No chest pain, orthopnea, PND, swelling in lower extremities, anasarca, dizziness, palpitations, syncope Resp: +nocturnal cough, snoring. No shortness of breath with exertion or at rest. No excess mucus or change in color of mucus. No productive or non-productive. No hemoptysis. No wheezing.  No chest wall deformity GI:  No heartburn, indigestion GU: No nocturia MSK:  No joint pain or swelling.   Neuro: No dizziness or lightheadedness.  Psych: No depression or anxiety. Mood stable.   Observations/Objective: Patient is well-developed, well-nourished in no acute distress.  Resting comfortably at home.  No labored breathing.  Speech is clear and coherent with logical content.  Patient is alert and oriented at baseline.   Assessment and Plan: Mild obstructive sleep apnea Mild OSA. Discussed minimal cardiovascular risk with mild OSA; however, given significant symptoms shared decision to move forward with CPAP therapy. Oral appliance is not a good option for her right now due to current orthodontic bills with Invisalign. Orders placed for auto CPAP 5-15 cmH2O, mask of choice, heated  humidity. Educated on proper care/use of device. Risks/benefits reviewed. Healthy weight loss encouraged. Aware of safe driving practices.   Patient Instructions  Start to use CPAP every night, minimum of 4-6 hours a night.  Change equipment as directed. Wash your tubing with warm soap and water daily, hang to dry. Wash humidifier portion weekly. Use bottled, distilled water and change daily Be aware of reduced alertness and do not drive or operate heavy machinery if experiencing this or drowsiness.  Exercise encouraged, as tolerated. Healthy weight management discussed.  Avoid or decrease alcohol consumption and medications that make you more sleepy, if possible. Notify if persistent daytime sleepiness occurs even with consistent use of PAP therapy.  We discussed how untreated sleep apnea puts an individual at risk for cardiac arrhthymias, pulm HTN, DM, stroke and increases their risk for daytime accidents. We also briefly reviewed treatment options including weight loss, side sleeping position, oral appliance, CPAP  therapy   Change supplies... Every month Mask cushions and/or nasal pillows CPAP machine filters Every 3 months Mask frame (not including the headgear) CPAP tubing Every 6 months Mask headgear Chin strap (if applicable) Humidifier water tub  Change sooner if soiled  Follow up in 10-12 weeks with Katie Remberto Lienhard,NP, or sooner, if needed    Obesity, Class II, BMI 35-39.9 Healthy weight loss encouraged.   I discussed the assessment and treatment plan with the patient. The patient was provided an opportunity to ask questions and all were answered. The patient agreed with the plan and demonstrated an understanding of the instructions.   The patient was advised to call back or seek an in-person evaluation if the symptoms worsen or if the condition fails to improve as anticipated.  I provided 32 minutes of non-face-to-face time during this encounter.   Noemi Chapel, NP

## 2023-12-20 NOTE — Patient Instructions (Signed)
Start to use CPAP every night, minimum of 4-6 hours a night.  Change equipment as directed. Wash your tubing with warm soap and water daily, hang to dry. Wash humidifier portion weekly. Use bottled, distilled water and change daily Be aware of reduced alertness and do not drive or operate heavy machinery if experiencing this or drowsiness.  Exercise encouraged, as tolerated. Healthy weight management discussed.  Avoid or decrease alcohol consumption and medications that make you more sleepy, if possible. Notify if persistent daytime sleepiness occurs even with consistent use of PAP therapy.  We discussed how untreated sleep apnea puts an individual at risk for cardiac arrhthymias, pulm HTN, DM, stroke and increases their risk for daytime accidents. We also briefly reviewed treatment options including weight loss, side sleeping position, oral appliance, CPAP therapy   Change supplies... Every month Mask cushions and/or nasal pillows CPAP machine filters Every 3 months Mask frame (not including the headgear) CPAP tubing Every 6 months Mask headgear Chin strap (if applicable) Humidifier water tub  Change sooner if soiled  Follow up in 10-12 weeks with Michaela Katrina Brosh,NP, or sooner, if needed

## 2024-07-19 ENCOUNTER — Encounter (HOSPITAL_COMMUNITY): Payer: Self-pay | Admitting: Obstetrics and Gynecology

## 2024-07-19 ENCOUNTER — Inpatient Hospital Stay (HOSPITAL_COMMUNITY)
Admission: AD | Admit: 2024-07-19 | Discharge: 2024-07-20 | Disposition: A | Discharge status: Home / Self Care | Attending: Obstetrics and Gynecology | Admitting: Obstetrics and Gynecology

## 2024-07-19 ENCOUNTER — Other Ambulatory Visit: Payer: Self-pay

## 2024-07-19 DIAGNOSIS — O26893 Other specified pregnancy related conditions, third trimester: Secondary | ICD-10-CM

## 2024-07-19 DIAGNOSIS — Z794 Long term (current) use of insulin: Secondary | ICD-10-CM | POA: Diagnosis not present

## 2024-07-19 DIAGNOSIS — O09523 Supervision of elderly multigravida, third trimester: Secondary | ICD-10-CM | POA: Insufficient documentation

## 2024-07-19 DIAGNOSIS — Z3A34 34 weeks gestation of pregnancy: Secondary | ICD-10-CM

## 2024-07-19 DIAGNOSIS — R55 Syncope and collapse: Secondary | ICD-10-CM | POA: Diagnosis not present

## 2024-07-19 DIAGNOSIS — O24414 Gestational diabetes mellitus in pregnancy, insulin controlled: Secondary | ICD-10-CM | POA: Insufficient documentation

## 2024-07-19 LAB — CBC
HCT: 32.4 % — ABNORMAL LOW (ref 36.0–46.0)
Hemoglobin: 10.3 g/dL — ABNORMAL LOW (ref 12.0–15.0)
MCH: 26.5 pg (ref 26.0–34.0)
MCHC: 31.8 g/dL (ref 30.0–36.0)
MCV: 83.3 fL (ref 80.0–100.0)
Platelets: 321 K/uL (ref 150–400)
RBC: 3.89 MIL/uL (ref 3.87–5.11)
RDW: 15.6 % — ABNORMAL HIGH (ref 11.5–15.5)
WBC: 7.4 K/uL (ref 4.0–10.5)
nRBC: 0 % (ref 0.0–0.2)

## 2024-07-19 LAB — COMPREHENSIVE METABOLIC PANEL WITH GFR
ALT: 16 U/L (ref 0–44)
AST: 19 U/L (ref 15–41)
Albumin: 2.8 g/dL — ABNORMAL LOW (ref 3.5–5.0)
Alkaline Phosphatase: 98 U/L (ref 38–126)
Anion gap: 9 (ref 5–15)
BUN: 9 mg/dL (ref 6–20)
CO2: 20 mmol/L — ABNORMAL LOW (ref 22–32)
Calcium: 8.8 mg/dL — ABNORMAL LOW (ref 8.9–10.3)
Chloride: 101 mmol/L (ref 98–111)
Creatinine, Ser: 0.6 mg/dL (ref 0.44–1.00)
GFR, Estimated: >60 mL/min
Glucose, Bld: 120 mg/dL — ABNORMAL HIGH (ref 70–99)
Potassium: 3.7 mmol/L (ref 3.5–5.1)
Sodium: 130 mmol/L — ABNORMAL LOW (ref 135–145)
Total Bilirubin: 0.6 mg/dL (ref 0.0–1.2)
Total Protein: 6.6 g/dL (ref 6.5–8.1)

## 2024-07-19 LAB — GLUCOSE, CAPILLARY: Glucose-Capillary: 114 mg/dL — ABNORMAL HIGH (ref 70–99)

## 2024-07-19 NOTE — MAU Note (Addendum)
 Michaela Diaz is a 38 y.o. at Unknown here in MAU reporting: arrival by EMS for syncopal episode that occurred around 1950 where she reports where she became hot, sweaty, and feeling generalized weakness. Reports having a headache, but has not taken anything for the headache. Reports all sxs started during her episode. Patient reports that she was able to catch herself and make to her recliner before going completely out. Patient states that she did not fall, hit her head, or abdomen. States that she has been having these episode off and on since started taking her insulin about 3 weeks ago. EMS checked her blood glucose in route to MAU and it was 112. States that she stopped taking her insulin for about 2 weeks due to having these episodes, but started back taking it on this past Monday because blood sugars were running high around around 150s. +FM. Denies Ctxs, VB and LOF.   Onset of complaint: 1950 Pain score: 6/10 headache There were no vitals filed for this visit.   FHT: 155  Lab orders placed from triage: n/a

## 2024-07-19 NOTE — MAU Provider Note (Signed)
 History     CSN: [id number]  Arrival date and time: 07/19/24 2104   Event Date/Time   First Provider Initiated Contact with Patient 07/19/24 2150      Chief Complaint  Patient presents with   Loss of Consciousness   HPI [name] is a 38 y.o. year old [id number] female at [redacted]w[redacted]d weeks gestation who presents to MAU via EMS reporting a syncopal episode at approximately 84 tonight.  She reports she felt hot, sweaty and generalized weakness.  She also reports a headache that she is rating 6/10.  She has taken no medications for the headache.  She denies falling hitting her abdomen or head because she was able to sit herself in a recliner before going completely out.  She has been diagnosed with A2 GDM.  She reports her blood sugars running around 110 112.  She last ate at 4:00 at [facility] and 2 hours later her blood sugars were 110-112.  She reports off-and-on episodes like this since being started on insulin 3 weeks ago.  She reports she stopped taking insulin x 2 weeks but started back this past Monday, 07/13/2024 because blood sugars were running in the 150s.  She wears a Dexcom monitor and has to take Humulin in 8 units in the a.m and 6 units in the p.m.  The patient reports she is not taking the insulin as prescribed but as random only needed by herself she reports positive fetal movement she denies vaginal bleeding or loss of fluid.  She receives her prenatal care at [facility] and has an appointment this week.  Her significant other is present and contributed to history taking.   OB History     Gravida  5   Para  4   Term  4   Preterm      AB      Living  4      SAB      IAB      Ectopic      Multiple      Live Births  4           Past Medical History:  Diagnosis Date   Depression    Diabetes mellitus without complication (HCC)    Thyroid disease     Past Surgical History:  Procedure Laterality Date   removal of extra digits     WISDOM  TOOTH EXTRACTION      Family History  Problem Relation Age of Onset   Diabetes Mother    Depression Mother     Social History   Tobacco Use   Smoking status: Former    Current packs/day: 0.00    Types: Cigarettes    Quit date: 08/2022    Years since quitting: 1.9  Substance Use Topics   Alcohol use: Yes   Drug use: Never    Allergies:  Allergies  Allergen Reactions   Metronidazole  Nausea Only   Buspirone Hcl Other (See Comments)    headaches   Latex Rash    No medications prior to admission.    Review of Systems  Constitutional: Negative.   HENT: Negative.    Eyes: Negative.   Respiratory: Negative.    Cardiovascular: Negative.   Gastrointestinal: Negative.   Endocrine: Negative.   Genitourinary: Negative.   Musculoskeletal: Negative.   Skin: Negative.   Allergic/Immunologic: Negative.   Neurological:  Positive for syncope and weakness.  Hematological: Negative.   Psychiatric/Behavioral: Negative.     Physical Exam  Patient Vitals for the past 24 hrs:  BP Temp Temp src Pulse Resp SpO2 Height  07/20/24 0015 110/60 -- -- (!) 105 -- -- --  07/20/24 0010 (!) 95/55 -- -- (!) 106 -- -- --  07/19/24 2115 106/64 -- -- (!) 115 -- 99 % --  07/19/24 2105 104/68 98.4 F (36.9 C) Oral (!) 117 16 99 % 5' 1.5 (1.562 m)     Physical Exam Vitals and nursing note reviewed.  Constitutional:      Appearance: Normal appearance. She is obese.  HENT:     Head: Normocephalic and atraumatic.  Cardiovascular:     Rate and Rhythm: Tachycardia present.  Pulmonary:     Effort: Pulmonary effort is normal.  Abdominal:     Palpations: Abdomen is soft.  Genitourinary:    Comments: Not indicated Musculoskeletal:        General: Normal range of motion.  Skin:    General: Skin is warm and dry.  Neurological:     Mental Status: She is alert and oriented to person, place, and time.  Psychiatric:        Attention and Perception: Attention and perception normal.         Mood and Affect: Mood and affect normal.        Speech: Speech normal.        Behavior: Behavior normal. Behavior is cooperative.        Thought Content: Thought content normal.        Cognition and Memory: Cognition and memory normal.        Judgment: Judgment normal.    REACTIVE NST - FHR: 150 bpm / moderate variability / accels present / decels absent / TOCO: 1 UC with UI  MAU Course  Procedures  MDM POC CBG CBC CMP  *Consult with Dr. Zina @ 2209 - notified of patient's complaints, assessments, lab & NST results, recommended tx plan order CBC & CMP, if WNL can d/c home and f/u with OB provider in 1-2 days  Results for orders placed or performed during the hospital encounter of 07/19/24 (from the past 24 hours)  Glucose, capillary     Status: Abnormal   Collection Time: 07/19/24  9:49 PM  Result Value Ref Range   Glucose-Capillary 114 (H) 70 - 99 mg/dL  CBC     Status: Abnormal   Collection Time: 07/19/24 10:38 PM  Result Value Ref Range   WBC 7.4 4.0 - 10.5 K/uL   RBC 3.89 3.87 - 5.11 MIL/uL   Hemoglobin 10.3 (L) 12.0 - 15.0 g/dL   HCT 67.5 (L) 63.9 - 53.9 %   MCV 83.3 80.0 - 100.0 fL   MCH 26.5 26.0 - 34.0 pg   MCHC 31.8 30.0 - 36.0 g/dL   RDW 84.3 (H) 88.4 - 84.4 %   Platelets 321 150 - 400 K/uL   nRBC 0.0 0.0 - 0.2 %  Comprehensive metabolic panel with GFR     Status: Abnormal   Collection Time: 07/19/24 10:38 PM  Result Value Ref Range   Sodium 130 (L) 135 - 145 mmol/L   Potassium 3.7 3.5 - 5.1 mmol/L   Chloride 101 98 - 111 mmol/L   CO2 20 (L) 22 - 32 mmol/L   Glucose, Bld 120 (H) 70 - 99 mg/dL   BUN 9 6 - 20 mg/dL   Creatinine, Ser 9.39 0.44 - 1.00 mg/dL   Calcium 8.8 (L) 8.9 - 10.3 mg/dL   Total Protein 6.6 6.5 -  8.1 g/dL   Albumin 2.8 (L) 3.5 - 5.0 g/dL   AST 19 15 - 41 U/L   ALT 16 0 - 44 U/L   Alkaline Phosphatase 98 38 - 126 U/L   Total Bilirubin 0.6 0.0 - 1.2 mg/dL   GFR, Estimated >39 >39 mL/min   Anion gap 9 5 - 15     Assessment and Plan   1. Syncope, unspecified syncope type (Primary) - Asymptomatic and no real cause for syncope  2. Gestational diabetes mellitus (GDM) requiring insulin - Please continue insulin regimen per your provider's orders - Discuss potential changes to insulin type or dosing at her next OB appt  3. [redacted] weeks gestation of pregnancy   - Discharge home - Keep scheduled appts with [facility] on 9/16 & MFM on 9/17 - Patient verbalized an understanding of the plan of care and agrees.   Ala Cart, CNM 07/20/2024, 9:50 PM

## 2024-07-19 NOTE — Discharge Instructions (Signed)
 If these fainting/near fainting episodes continue to occur, please go to [facility] where your pregnancy providers are able to care for you directly.

## 2024-07-20 DIAGNOSIS — O24414 Gestational diabetes mellitus in pregnancy, insulin controlled: Secondary | ICD-10-CM | POA: Diagnosis not present
